# Patient Record
Sex: Female | Born: 1970 | Race: White | Hispanic: No | Marital: Married | Smoking: Never smoker
Health system: Southern US, Community
[De-identification: ages and names within clinical notes are randomized; demographics above are authoritative.]

## PROBLEM LIST (undated history)

## (undated) DIAGNOSIS — K589 Irritable bowel syndrome without diarrhea: Secondary | ICD-10-CM

## (undated) DIAGNOSIS — J45909 Unspecified asthma, uncomplicated: Secondary | ICD-10-CM

## (undated) DIAGNOSIS — K219 Gastro-esophageal reflux disease without esophagitis: Secondary | ICD-10-CM

## (undated) DIAGNOSIS — N200 Calculus of kidney: Secondary | ICD-10-CM

## (undated) DIAGNOSIS — J349 Unspecified disorder of nose and nasal sinuses: Secondary | ICD-10-CM

## (undated) DIAGNOSIS — G589 Mononeuropathy, unspecified: Secondary | ICD-10-CM

## (undated) DIAGNOSIS — Z0282 Encounter for adoption services: Secondary | ICD-10-CM

## (undated) DIAGNOSIS — R011 Cardiac murmur, unspecified: Secondary | ICD-10-CM

## (undated) DIAGNOSIS — N3090 Cystitis, unspecified without hematuria: Secondary | ICD-10-CM

## (undated) HISTORY — DX: Unspecified asthma, uncomplicated: J45.909

## (undated) HISTORY — PX: WISDOM TOOTH EXTRACTION: SHX21

## (undated) HISTORY — DX: Calculus of kidney: N20.0

## (undated) HISTORY — DX: Encounter for adoption services: Z02.82

## (undated) HISTORY — DX: Mononeuropathy, unspecified: G58.9

## (undated) HISTORY — DX: Irritable bowel syndrome without diarrhea: K58.9

## (undated) HISTORY — DX: Unspecified disorder of nose and nasal sinuses: J34.9

## (undated) HISTORY — DX: Cardiac murmur, unspecified: R01.1

## (undated) HISTORY — DX: Cystitis, unspecified without hematuria: N30.90

## (undated) HISTORY — DX: Gastro-esophageal reflux disease without esophagitis: K21.9

---

## 2014-04-05 ENCOUNTER — Ambulatory Visit: Payer: Self-pay

## 2014-05-23 HISTORY — PX: KIDNEY STONE SURGERY: SHX686

## 2014-06-07 ENCOUNTER — Ambulatory Visit: Payer: Self-pay | Admitting: Internal Medicine

## 2014-06-12 ENCOUNTER — Ambulatory Visit: Payer: Self-pay

## 2014-07-04 ENCOUNTER — Ambulatory Visit: Payer: Self-pay | Admitting: Urology

## 2014-07-04 LAB — CBC
HCT: 48 % — AB (ref 35.0–47.0)
HGB: 15.8 g/dL (ref 12.0–16.0)
MCH: 30.7 pg (ref 26.0–34.0)
MCHC: 33 g/dL (ref 32.0–36.0)
MCV: 93 fL (ref 80–100)
Platelet: 185 10*3/uL (ref 150–440)
RBC: 5.15 10*6/uL (ref 3.80–5.20)
RDW: 12.8 % (ref 11.5–14.5)
WBC: 6.2 10*3/uL (ref 3.6–11.0)

## 2014-07-04 LAB — BASIC METABOLIC PANEL
Anion Gap: 7 (ref 7–16)
BUN: 10 mg/dL (ref 7–18)
CO2: 27 mmol/L (ref 21–32)
Calcium, Total: 8.6 mg/dL (ref 8.5–10.1)
Chloride: 107 mmol/L (ref 98–107)
Creatinine: 0.66 mg/dL (ref 0.60–1.30)
EGFR (African American): >60
Glucose: 85 mg/dL (ref 65–99)
OSMOLALITY: 280 (ref 275–301)
POTASSIUM: 4.3 mmol/L (ref 3.5–5.1)
Sodium: 141 mmol/L (ref 136–145)

## 2014-07-11 ENCOUNTER — Ambulatory Visit: Payer: Self-pay | Admitting: Urology

## 2014-07-30 ENCOUNTER — Ambulatory Visit: Payer: Self-pay | Admitting: Urology

## 2014-11-24 ENCOUNTER — Emergency Department
Admit: 2014-11-24 | Disposition: A | Discharge status: Home / Self Care | Payer: Self-pay | Admitting: Emergency Medicine

## 2014-11-24 LAB — URINALYSIS, COMPLETE
BILIRUBIN, UR: NEGATIVE
Bacteria: NONE SEEN
Glucose,UR: NEGATIVE mg/dL (ref 0–75)
Ketone: NEGATIVE
LEUKOCYTE ESTERASE: NEGATIVE
Nitrite: NEGATIVE
Ph: 7 (ref 4.5–8.0)
Protein: NEGATIVE
RBC,UR: 1 /HPF (ref 0–5)
Specific Gravity: 1.018 (ref 1.003–1.030)

## 2014-11-24 LAB — CBC
HCT: 49.1 % — ABNORMAL HIGH (ref 35.0–47.0)
HGB: 15.9 g/dL (ref 12.0–16.0)
MCH: 29.8 pg (ref 26.0–34.0)
MCHC: 32.4 g/dL (ref 32.0–36.0)
MCV: 92 fL (ref 80–100)
Platelet: 159 10*3/uL (ref 150–440)
RBC: 5.35 10*6/uL — AB (ref 3.80–5.20)
RDW: 13.5 % (ref 11.5–14.5)
WBC: 6.5 10*3/uL (ref 3.6–11.0)

## 2014-11-24 LAB — COMPREHENSIVE METABOLIC PANEL
ALK PHOS: 113 U/L
ANION GAP: 5 — AB (ref 7–16)
Albumin: 3.6 g/dL
BILIRUBIN TOTAL: 0.4 mg/dL
BUN: 14 mg/dL
CHLORIDE: 107 mmol/L
CREATININE: 0.65 mg/dL
Calcium, Total: 8.6 mg/dL — ABNORMAL LOW
Co2: 27 mmol/L
EGFR (African American): >60
EGFR (Non-African Amer.): >60
Glucose: 108 mg/dL — ABNORMAL HIGH
POTASSIUM: 4.8 mmol/L
SGOT(AST): 29 U/L
SGPT (ALT): 24 U/L
Sodium: 139 mmol/L
Total Protein: 6.6 g/dL

## 2014-11-24 LAB — LIPASE, BLOOD: Lipase: 30 U/L

## 2014-11-24 LAB — TROPONIN I

## 2014-11-25 ENCOUNTER — Ambulatory Visit
Admit: 2014-11-25 | Disposition: A | Discharge status: Home / Self Care | Payer: Self-pay | Attending: Nurse Practitioner | Admitting: Nurse Practitioner

## 2014-11-27 ENCOUNTER — Encounter: Payer: Self-pay | Admitting: General Surgery

## 2014-11-27 ENCOUNTER — Ambulatory Visit (INDEPENDENT_AMBULATORY_CARE_PROVIDER_SITE_OTHER): Payer: Medicaid Other | Admitting: General Surgery

## 2014-11-27 ENCOUNTER — Ambulatory Visit: Admit: 2014-11-27 | Disposition: A | Discharge status: Home / Self Care | Payer: Self-pay | Admitting: General Surgery

## 2014-11-27 VITALS — BP 110/62 | HR 72 | Temp 98.5°F | Resp 12 | Ht 61.0 in | Wt 137.0 lb

## 2014-11-27 DIAGNOSIS — R1011 Right upper quadrant pain: Secondary | ICD-10-CM | POA: Insufficient documentation

## 2014-11-27 LAB — CBC WITH DIFFERENTIAL/PLATELET
Basophil #: 0 10*3/uL (ref 0.0–0.1)
Basophil %: 0.1 %
EOS ABS: 0 10*3/uL (ref 0.0–0.7)
Eosinophil %: 0.2 %
HCT: 51.9 % — ABNORMAL HIGH (ref 35.0–47.0)
HGB: 17.2 g/dL — ABNORMAL HIGH (ref 12.0–16.0)
Lymphocyte #: 1 10*3/uL (ref 1.0–3.6)
Lymphocyte %: 13.6 %
MCH: 30.1 pg (ref 26.0–34.0)
MCHC: 33.1 g/dL (ref 32.0–36.0)
MCV: 91 fL (ref 80–100)
Monocyte #: 0.6 x10 3/mm (ref 0.2–0.9)
Monocyte %: 8.3 %
NEUTROS ABS: 5.8 10*3/uL (ref 1.4–6.5)
Neutrophil %: 77.8 %
Platelet: 152 10*3/uL (ref 150–440)
RBC: 5.71 10*6/uL — ABNORMAL HIGH (ref 3.80–5.20)
RDW: 13.7 % (ref 11.5–14.5)
WBC: 7.5 10*3/uL (ref 3.6–11.0)

## 2014-11-27 NOTE — Addendum Note (Signed)
Addended by: Currie ParisHATCH, Britnay Magnussen M on: 11/27/2014 01:39 PM   Modules accepted: Level of Service

## 2014-11-27 NOTE — Progress Notes (Addendum)
Patient ID: Annette Kirby, female   DOB: [birth year], 44 y.o.   MRN: [id number]  Chief Complaint  Patient presents with  . Abdominal Pain    HPI Annette Kirby is a 44 y.o. female.  Here today for evalatuion of right abdominal pain. She states the pain started Friday. She is more nauseated, no vomiting. When she eats the pain is worse. Denies diarrhea. At times the pain has doubled her over. She states she has been bloated for at least a week. Bowels are regular.  The patient was diagnosed with IBS when she was in college, and this would manifest itself with intermittent bloating without severe pain. She reports she has never been troubled with severe constipation or diarrhea.  Over the years she has had trouble with irritable bowel syndrome which was associated with bloating.  Ultrasound was 11-24-14. Exam through the ED department. Normal ultrasound. Normal labs. CT scan was done 11-25-14. Reported appendix upper limit of normal to the left of the midline without periappendiceal inflammation.  She is here today with her husband. They are raising his daughter's 3 teenage daughters.   HPI  Past Medical History  Diagnosis Date  . Kidney stone   . GERD (gastroesophageal reflux disease)   . Sinus problem   . Asthma   . Irritable bowel syndrome   . Murmur   . Pinched nerve     External genitalia    Past Surgical History  Procedure Laterality Date  . Kidney stone surgery  Oct 2015    Dr Apolinar Junes  . Kidney stone surgery Left October 2015    Transurethral removal by Dr. Apolinar Junes.    Family History  Problem Relation Age of Onset  . Adopted: Yes  . Cancer Mother     Social History History  Substance Use Topics  . Smoking status: Never Smoker   . Smokeless tobacco: Never Used  . Alcohol Use: No    No Known Allergies  Current Outpatient Prescriptions  Medication Sig Dispense Refill  . ALPRAZolam (XANAX) 0.5 MG tablet Take 0.5 mg by mouth at bedtime as needed for  anxiety.    . Cholecalciferol (VITAMIN D-3) 1000 UNITS CAPS Take by mouth daily.    . CRYSELLE-28 0.3-30 MG-MCG tablet Take 1 tablet by mouth daily.   11  . loratadine (CLARITIN) 10 MG tablet Take 10 mg by mouth daily.    . ondansetron (ZOFRAN) 4 MG tablet Take 4 mg by mouth every 8 (eight) hours as needed for nausea or vomiting.    . pregabalin (LYRICA) 75 MG capsule Take 75 mg by mouth daily.    . traMADol (ULTRAM) 50 MG tablet Take 50 mg by mouth every 12 (twelve) hours as needed.     No current facility-administered medications for this visit.    Review of Systems Review of Systems  Constitutional: Negative.   Respiratory: Negative.   Cardiovascular: Negative.   Gastrointestinal: Positive for nausea and abdominal pain. Negative for vomiting and diarrhea.  Neurological: Positive for dizziness.    Blood pressure 110/62, pulse 72, temperature 98.5 F (36.9 C), temperature source Oral, resp. rate 12, height  (1.549 m), weight 137 lb (62.143 kg), last menstrual period 11/23/2014.  Physical Exam Physical Exam  Constitutional: She is oriented to person, place, and time. She appears well-developed and well-nourished.  Neck: Neck supple.  Cardiovascular: Normal rate and regular rhythm.   Murmur heard.  Systolic murmur is present with a grade of 1/6  Pulmonary/Chest: Effort normal and  breath sounds normal.  Abdominal: Soft. Normal appearance and bowel sounds are normal. There is tenderness in the right upper quadrant and left upper quadrant.    No referred pain. Scant left upper quadrant tenderness. No lower abdominal tenderness.  Evidence of discomfort with change in position from seated to supine.  Lymphadenopathy:    She has no cervical adenopathy.  Neurological: She is alert and oriented to person, place, and time.  Skin: Skin is warm and dry.    Data Reviewed Laboratory studies dated 11/24/2014 obtained to the emergency department were reviewed. Sinus rhythm with short  PR interval. Negative urinalysis (sees one plumps blood, 1 RBC/1 WBC per high-power field. Laboratory studies showed a hemoglobin of 15.9 with a white blood cell count of 6500. Platelet count of 159,000. Non-random blood sugar 108. Creatinine 0.6. Normal electrolytes. Normal liver function studies. Negative urine pregnancy test.  Abdominal ultrasound dated 11/24/2014 showed no gallbladder wall thickening or gallstones. Common bile duct 2.7 mm.  Abdominal CT dated 11/25/2014 showed the cecum MiraLAX mesentery with the appendix to the left of the midline. Chronic appendicolith. No strong evidence of acute appendicitis. Trace free fluid in the left hemipelvis adjacent to the adnexa. No acute inflammatory process. Nephrolithiasis improved from 2015. The patient has a fairly significant redundant colon, with stool through the right side suggesting long-standing low transit.  06/12/2014 CT obtained for suspected stone. Clinical presentation at that time was 3 weeks of nausea and low back pain and a history of kidney stones. Bilateral nonobstructing stones noted. Independent review of the films shows the appendix and the same size and shape as on her 11/25/2014 exam.  PCP notes of 11/25/2014 completed by Vincent GrosHeather Boscia, NP were reviewed. Epigastric, right upper quadrant and suprapubic pain reported.  ED record of 11/24/2014 showed pain in the retrosternal area as well as the hypogastrium.  Assessment    Unexplained abdominal pain, unlikely related to appendix.    Plan    With the focal tenderness being present in the right upper quadrant, we'll arrange for a HIDA scan with CCK and repeat her CBC. There is no history suggest peptic ulcer disease, and the patient has no risk factors for this  smoking close menses. No antecedent weight loss or weight gain except the normal monthly changes she appreciates with her menses.    Repeat CBC and schedule HIDA scan with CCK.  The patient was seen at the  hospital after her HIDA was CCK. She reports the CCK injection caused a severe amount of abdominal pain, 12 on a scale of 10. Her gallbladder ejection fraction was at the low end of normal at 36%. Prompt filling of the gallbladder and normal emptying of the common bile duct is identified. I think it's unlikely the biliary tree as the source for present symptoms.  Repeat CBC showed hemoglobin up to 17.1 suggestive of some degree of dehydration. White blood cell count remains normal.  On further questioning of the patient and her husband she can drink vitamin water without pain, and any other solid or liquid causes discomfort. She has no risk factors for peptic ulcer disease. I have no clear indication for surgical intervention. I think she is concerned that people think the pain is "in her head". I tried to reassure her as is not the case, but there is nothing objective to treat at this time, and have suggested attempts at symptomatic relief until things declare themselves.  She reports difficulty with swallowing pills. A prescription for Tylenol  3, 12.5 mg per 5 mL, 150 mL with the inscription 5-10 mL by mouth every 4 hours when necessary for pain was provided. Open C she is use this in the past with good results) sees. She was also placed on Prilosec 20 mg by mouth twice a day, Celebrex 100 mg twice a day and Reglan 10 mg before meals/daily at bedtime for the next week. She was asked to give a phone follow-up on Friday, April 8 shortly after 8:00 in the morning. Office visit will take place early next week.  PCP:  Clyda Hurdle 11/27/2014, 8:50 AM

## 2014-11-27 NOTE — Addendum Note (Signed)
Addended by: Earline MayotteBYRNETT, Gillian Meeuwsen W on: 11/27/2014 02:48 PM   Modules accepted: Level of Service

## 2014-11-27 NOTE — Patient Instructions (Signed)
Repeat labs, and schedule HIDA scan

## 2014-11-29 ENCOUNTER — Telehealth: Payer: Self-pay

## 2014-11-29 NOTE — Telephone Encounter (Signed)
Spoke with patient about having an upper endoscopy done next week and she is amendable to this. Patient is scheduled for an upper endoscopy at [facility] on 12/03/14.  She will call the hospital today to get pre registered. She is aware to call the day before between 1 and 3 pm to get her arrival time. Patient is aware of date and instructions.

## 2014-11-29 NOTE — Telephone Encounter (Signed)
Patient called to give an update on her condition today. She reports that her discomfort is about the same. She states that she is having burning and pain mostly in the mid abdomin just above her bellybutton. She has not gotten relief with the medications she is currently on. She is scheduled to be seen here on Monday 12/02/14. She was wondering if an upper endoscopy may be an option.

## 2014-11-29 NOTE — Telephone Encounter (Signed)
No clear etiology for the patient's pain. We'll arrange for an upper endoscopy.

## 2014-12-02 ENCOUNTER — Encounter: Payer: Self-pay | Admitting: General Surgery

## 2014-12-02 ENCOUNTER — Other Ambulatory Visit: Payer: Self-pay | Admitting: General Surgery

## 2014-12-02 ENCOUNTER — Ambulatory Visit (INDEPENDENT_AMBULATORY_CARE_PROVIDER_SITE_OTHER): Payer: Medicaid Other | Admitting: General Surgery

## 2014-12-02 VITALS — BP 130/78 | HR 88 | Temp 97.9°F | Resp 14 | Ht 61.0 in | Wt 139.0 lb

## 2014-12-02 DIAGNOSIS — R1011 Right upper quadrant pain: Secondary | ICD-10-CM

## 2014-12-02 NOTE — Progress Notes (Signed)
Patient ID: Annette Kirby, female   DOB: June 24, 1971, 44 y.o.   MRN: [id number]  Chief Complaint  Patient presents with  . Follow-up    abdominal pain    HPI Annette Kirby is a 44 y.o. female here following up for abdominal pain. She is scheduled for an upper endoscopy at [facility] for tomorrow 12/03/14. She reports that her stomach pain has improved and is now a 5 on the pain scale. The pain is now mostly in her mid abdomen. She gets relief if she is sitting back in a relaxed position. She is able to eat small amounts of food with out too much trouble. She is still having nausea, gas, and bloating. She does report two episodes of diaphoresis but no fever.   At the time of her last visit she was placed on Prilosec twice a day, Celebrex 100 mg twice a day and Reglan 10 mg with meals and at bedtime. A prescription for Tylenol 3 to use 5-10 mL by mouth every 4 hours if needed for pain was also provided.  She reported no real improvement after the first 48 hours and today is her planned follow-up.  She still reports pain with almost all oral intake, now including grits. Pain well-developed within 5 minutes of ingestion. Where her pain was in the right upper quadrant 5 days ago, now she reports more discomfort in the mid abdomen. There has been no vomiting. Bowels have been moving fairly normally.  While she reports feeling no better, her demeanor, her skin color, her movement in the office on his improved from last week.   HPI   Past Medical History  Diagnosis Date  . Kidney stone   . GERD (gastroesophageal reflux disease)   . Sinus problem   . Asthma   . Irritable bowel syndrome   . Murmur   . Pinched nerve     External genitalia    Past Surgical History  Procedure Laterality Date  . Kidney stone surgery  Oct 2015    Dr Apolinar Junes  . Kidney stone surgery Left October 2015    Transurethral removal by Dr. Apolinar Junes.    Family History  Problem Relation Age of Onset  . Adopted: Yes   . Cancer Mother     Social History History  Substance Use Topics  . Smoking status: Never Smoker   . Smokeless tobacco: Never Used  . Alcohol Use: No    No Known Allergies  Current Outpatient Prescriptions  Medication Sig Dispense Refill  . ALPRAZolam (XANAX) 0.5 MG tablet Take 0.5 mg by mouth at bedtime as needed for anxiety.    . Cholecalciferol (VITAMIN D-3) 1000 UNITS CAPS Take by mouth daily.    . CRYSELLE-28 0.3-30 MG-MCG tablet Take 1 tablet by mouth daily.   11  . loratadine (CLARITIN) 10 MG tablet Take 10 mg by mouth daily.    . ondansetron (ZOFRAN) 4 MG tablet Take 4 mg by mouth every 8 (eight) hours as needed for nausea or vomiting.    . pregabalin (LYRICA) 75 MG capsule Take 75 mg by mouth daily.    . traMADol (ULTRAM) 50 MG tablet Take 50 mg by mouth every 12 (twelve) hours as needed.     No current facility-administered medications for this visit.    Review of Systems Review of Systems  Constitutional: Negative.   Respiratory: Negative.   Cardiovascular: Negative.   Gastrointestinal: Positive for nausea, abdominal pain and abdominal distention. Negative for vomiting, diarrhea, constipation, blood  in stool, anal bleeding and rectal pain.    Blood pressure 130/78, pulse 88, temperature 97.9 F (36.6 C), resp. rate 14, height 5\' 1"  (1.549 m), weight 139 lb (63.05 kg), last menstrual period 11/23/2014.  Physical Exam Physical Exam  Constitutional: She is oriented to person, place, and time. She appears well-developed and well-nourished.  Neck: Neck supple.  Cardiovascular: Normal rate, regular rhythm and normal heart sounds.   Pulmonary/Chest: Effort normal and breath sounds normal.  Abdominal: Normal appearance and bowel sounds are normal. There is tenderness in the right upper quadrant and left upper quadrant.    Lymphadenopathy:    She has no cervical adenopathy.  Neurological: She is alert and oriented to person, place, and time.  Skin: Skin is warm  and dry.    Data Reviewed Prior CT studies.  Assessment    Abdominal pain, no clear etiology.    Plan    As right upper quadrant and epigastric pain was most pronounced at the time of her original evaluation 5 days ago, arrangements are in place for an upper endoscopy tomorrow. The patient knows to be nothing by mouth for solid foods after midnight, but may have clear liquids until 8 AM.    PCP: Walden FieldKhan, Fozia M   Dayson Aboud, Merrily PewJeffrey W 12/02/2014, 7:59 PM

## 2014-12-02 NOTE — Patient Instructions (Signed)
Esophagogastroduodenoscopy °Esophagogastroduodenoscopy (EGD) is a procedure to examine the lining of the esophagus, stomach, and first part of the small intestine (duodenum). A long, flexible, lighted tube with a camera attached (endoscope) is inserted down the throat to view these organs. This procedure is done to detect problems or abnormalities, such as inflammation, bleeding, ulcers, or growths, in order to treat them. The procedure lasts about 5-20 minutes. It is usually an outpatient procedure, but it may need to be performed in emergency cases in the hospital. °LET YOUR CAREGIVER KNOW ABOUT:  °· Allergies to food or medicine. °· All medicines you are taking, including vitamins, herbs, eyedrops, and over-the-counter medicines and creams. °· Use of steroids (by mouth or creams). °· Previous problems you or members of your family have had with the use of anesthetics. °· Any blood disorders you have. °· Previous surgeries you have had. °· Other health problems you have. °· Possibility of pregnancy, if this applies. °RISKS AND COMPLICATIONS  °Generally, EGD is a safe procedure. However, as with any procedure, complications can occur. Possible complications include: °· Infection. °· Bleeding. °· Tearing (perforation) of the esophagus, stomach, or duodenum. °· Difficulty breathing or not being able to breath. °· Excessive sweating. °· Spasms of the larynx. °· Slowed heartbeat. °· Low blood pressure. °BEFORE THE PROCEDURE °· Do not eat or drink anything for 6-8 hours before the procedure or as directed by your caregiver. °· Ask your caregiver about changing or stopping your regular medicines. °· If you wear dentures, be prepared to remove them before the procedure. °· Arrange for someone to drive you home after the procedure. °PROCEDURE  °· A vein will be accessed to give medicines and fluids. A medicine to relax you (sedative) and a pain reliever will be given through that access into the vein. °· A numbing medicine  (local anesthetic) may be sprayed on your throat for comfort and to stop you from gagging or coughing. °· A mouth guard may be placed in your mouth to protect your teeth and to keep you from biting on the endoscope. °· You will be asked to lie on your left side. °· The endoscope is inserted down your throat and into the esophagus, stomach, and duodenum. °· Air is put through the endoscope to allow your caregiver to view the lining of your esophagus clearly. °· The esophagus, stomach, and duodenum is then examined. During the exam, your caregiver may: °¨ Remove tissue to be examined under a microscope (biopsy) for inflammation, infection, or other medical problems. °¨ Remove growths. °¨ Remove objects (foreign bodies) that are stuck. °¨ Treat any bleeding with medicines or other devices that stop tissues from bleeding (hot cautery, clipping devices). °¨ Widen (dilate) or stretch narrowed areas of the esophagus and stomach. °· The endoscope will then be withdrawn. °AFTER THE PROCEDURE °· You will be taken to a recovery area to be monitored. You will be able to go home once you are stable and alert. °· Do not eat or drink anything until the local anesthetic and numbing medicines have worn off. You may choke. °· It is normal to feel bloated, have pain with swallowing, or have a sore throat for a short time. This will wear off. °· Your caregiver should be able to discuss his or her findings with you. It will take longer to discuss the test results if any biopsies were taken. °Document Released: 12/10/2004 Document Revised: 12/24/2013 Document Reviewed: 07/12/2012 °[employer]® Patient Information ©2015 [employer]. This information is not   intended to replace advice given to you by your health care provider. Make sure you discuss any questions you have with your health care provider. ° °

## 2014-12-03 ENCOUNTER — Ambulatory Visit
Admit: 2014-12-03 | Disposition: A | Discharge status: Home / Self Care | Payer: Self-pay | Attending: General Surgery | Admitting: General Surgery

## 2014-12-03 ENCOUNTER — Encounter (INDEPENDENT_AMBULATORY_CARE_PROVIDER_SITE_OTHER): Payer: Medicaid Other | Admitting: General Surgery

## 2014-12-03 DIAGNOSIS — K2900 Acute gastritis without bleeding: Secondary | ICD-10-CM

## 2014-12-03 DIAGNOSIS — R1013 Epigastric pain: Secondary | ICD-10-CM | POA: Diagnosis not present

## 2014-12-03 HISTORY — PX: UPPER GI ENDOSCOPY: SHX6162

## 2014-12-04 ENCOUNTER — Encounter: Payer: Self-pay | Admitting: General Surgery

## 2014-12-10 ENCOUNTER — Other Ambulatory Visit: Payer: Self-pay | Admitting: General Surgery

## 2014-12-12 ENCOUNTER — Encounter: Payer: Self-pay | Admitting: General Surgery

## 2014-12-12 ENCOUNTER — Ambulatory Visit (INDEPENDENT_AMBULATORY_CARE_PROVIDER_SITE_OTHER): Payer: Medicaid Other | Admitting: General Surgery

## 2014-12-12 ENCOUNTER — Other Ambulatory Visit: Payer: Self-pay | Admitting: General Surgery

## 2014-12-12 VITALS — BP 98/64 | HR 102 | Resp 16 | Ht 61.0 in | Wt 136.0 lb

## 2014-12-12 DIAGNOSIS — R1011 Right upper quadrant pain: Secondary | ICD-10-CM

## 2014-12-12 DIAGNOSIS — R1084 Generalized abdominal pain: Secondary | ICD-10-CM

## 2014-12-12 DIAGNOSIS — K389 Disease of appendix, unspecified: Secondary | ICD-10-CM | POA: Diagnosis not present

## 2014-12-12 NOTE — Progress Notes (Signed)
Patient ID: Annette Kirby, female   DOB: 11/30/1970, 44 y.o.   MRN: [id number]  Chief Complaint  Patient presents with  . Routine Post Op    upper endoscopy    HPI Annette Kirby is a 44 y.o. female here today for her post op upper endoscopy done on 12/04/14.  Her bowels daily with fiber cereal and states there is a more pronounced odor. Denies nausea or diarrhea. States she still limits how much she eats. The pain is more in the mid abdomen at present, and into the left lower quadrant. She is also experiencing some left flank pain. (Sees previous left renal calculus removed with ureteroscope). She still has upper abdominal pain and her left flank. She is most comfortable lying on her right side.  She is being followed by Dr. Apolinar JunesBrandon and has a MRI scan scheduled for tomorrow.   HPI  Past Medical History  Diagnosis Date  . Kidney stone   . GERD (gastroesophageal reflux disease)   . Sinus problem   . Asthma   . Irritable bowel syndrome   . Murmur   . Pinched nerve     External genitalia    Past Surgical History  Procedure Laterality Date  . Kidney stone surgery  Oct 2015    Dr Apolinar JunesBrandon  . Kidney stone surgery Left October 2015    Transurethral removal by Dr. Apolinar JunesBrandon.  Marland Kitchen. Upper gi endoscopy  12-03-14    Dr Lemar LivingsByrnett    Family History  Problem Relation Age of Onset  . Adopted: Yes  . Cancer Mother     Social History History  Substance Use Topics  . Smoking status: Never Smoker   . Smokeless tobacco: Never Used  . Alcohol Use: No    No Known Allergies  Current Outpatient Prescriptions  Medication Sig Dispense Refill  . ALPRAZolam (XANAX) 0.5 MG tablet Take 0.5 mg by mouth at bedtime as needed for anxiety.    . Cholecalciferol (VITAMIN D-3) 1000 UNITS CAPS Take by mouth daily.    . CRYSELLE-28 0.3-30 MG-MCG tablet Take 1 tablet by mouth daily.   11  . loratadine (CLARITIN) 10 MG tablet Take 10 mg by mouth daily.    Marland Kitchen. omeprazole (PRILOSEC) 20 MG capsule TAKE  ONE CAPSULE BY MOUTH TWICE A DAY 30 capsule 0  . pregabalin (LYRICA) 75 MG capsule Take 75 mg by mouth daily.    . traMADol (ULTRAM) 50 MG tablet Take 50 mg by mouth every 12 (twelve) hours as needed.     No current facility-administered medications for this visit.    Review of Systems Review of Systems  Constitutional: Negative.   Respiratory: Negative.   Cardiovascular: Negative.   Gastrointestinal: Negative for nausea, vomiting, diarrhea and constipation.  Neurological: Positive for light-headedness.    Blood pressure 98/64, pulse 102, resp. rate 16, height 5\' 1"  (1.549 m), weight 136 lb (61.689 kg), last menstrual period 11/23/2014.  Physical Exam Physical Exam  Constitutional: She is oriented to person, place, and time. She appears well-developed and well-nourished.  Neck: Neck supple.  Cardiovascular: Normal rate, regular rhythm and normal heart sounds.   Pulmonary/Chest: Effort normal and breath sounds normal.  Abdominal: Soft. Normal appearance. There is tenderness in the periumbilical area and left lower quadrant.    Lymphadenopathy:    She has no cervical adenopathy.  Neurological: She is alert and oriented to person, place, and time.  Skin: Skin is warm and dry.    Data Reviewed EGD showed evidence  of gastritis.   Previous CT showed an appendicolith but the appendix to the left of the midline.  CBC dated 11/27/2014 was notable only for an elevated hemoglobin.  Assessment    Persistent abdominal pain, appendicolith, possible chronic appendicitis.    Plan    Upper abdominal evaluation with ultrasound and EGD has failed to elucidate a clear source for her pain. Initially her pain was more upper abdomen rather than mid/lower abdomen, but the former has cleared and the latter accelerated.  The possibility that appendectomy may not relieve all of her discomfort was reviewed. The risks associated with diagnostic procedures including injury to organs and infection  was discussed.    Risk and benefits of laparoscopic appendectomy, patient agrees.  Patient's surgery has been scheduled for 12-20-14 at [facility]  PCP:  Kara Dies 12/12/2014, 7:12 PM

## 2014-12-12 NOTE — Patient Instructions (Addendum)
Laparoscopic Appendectomy Appendectomy is surgery to remove the appendix. Laparoscopic surgery uses several small cuts (incisions) instead of one large incision. Laparoscopic surgery offers a shorter recovery time and less discomfort. LET YOUR CAREGIVER KNOW ABOUT:  Allergies to food or medicine.  Medicines taken, including vitamins, dietary supplements, herbs, eyedrops, over-the-counter medicines, and creams.  Use of steroids (by mouth or creams).  Previous problems with anesthetics or numbing medicines.  History of bleeding problems or blood clots.  Previous surgery.  Other health problems, including diabetes, heart problems, lung problems, and kidney problems.  Possibility of pregnancy, if this applies. RISKS AND COMPLICATIONS  Infection. A germ starts growing in the wound. This can usually be treated with antibiotics. In some cases, the wound will need to be opened and cleaned.  Bleeding.  Damage to other organs.  Sores (abscesses).  Chronic pain at the incision sites. This is defined as pain that lasts for more than 3 months.  Blood clots in the legs that may rarely travel to the lungs.  Infection in the lungs (pneumonia). BEFORE THE PROCEDURE Appendectomy is usually performed immediately after an inflamed appendix (appendicitis) is diagnosed. No preparation is necessary ahead of this procedure. PROCEDURE  You will be given medicine that makes you sleep (general anesthetic). After you are asleep, a flexible tube (catheter) may be inserted into your bladder to drain your urine during surgery. The tube is removed before you wake up after surgery. When you are asleep, carbondioxide gas will be used to inflate your abdomen. This will allow your surgeon to see inside your abdomen and perform your surgery. Three small incisions will be made in your abdomen. Your surgeon will insert a thin, lighted tube (laparoscope) through one of the incisions. Your surgeon will look through the  laparoscope while performing the surgery. Other tools will be inserted through the other incisions. Laparoscopic procedures may not be appropriate when:  There is major scarring from a previous surgery.  The patient has bleeding disorders.  A pregnancy is near term.  There are other conditions which make the laparoscopic procedure impossible, such as an advanced infection or a ruptured appendix. If your surgeon feels it is not safe to continue with the laparoscopic procedure, he or she will perform an open surgery instead. This gives the surgeon a larger view and more space to work. Open surgery requires a longer recovery time. After your appendix is removed, your incisions will be closed with stitches (sutures) or skin adhesive. AFTER THE PROCEDURE You will be taken to a recovery room. When the anesthesia has worn off, you will be returned to your hospital room. You will be given pain medicines to keep you comfortable. Ask your caregiver how long your hospital stay will be. Document Released: 03/23/2004 Document Revised: 11/01/2011 Document Reviewed: 02/16/2011 [facility] Patient Information 2015 [employer] [place]. This information is not intended to replace advice given to you by your health care provider. Make sure you discuss any questions you have with your health care provider.  [facility]

## 2014-12-13 ENCOUNTER — Ambulatory Visit
Admit: 2014-12-13 | Disposition: A | Discharge status: Home / Self Care | Payer: Self-pay | Attending: Obstetrics and Gynecology | Admitting: Obstetrics and Gynecology

## 2014-12-14 NOTE — Op Note (Signed)
PATIENT NAME:  Annette Kirby, ELOISE M MR#:  [id number] DATE OF BIRTH:  1971/03/03  DATE OF PROCEDURE:  07/11/2014  PREOPERATIVE DIAGNOSIS: Left renal stones.  POSTOPERATIVE DIAGNOSIS: Left renal stones.   PROCEDURE PERFORMED: Left ureteroscopy, laser lithotripsy, left ureteral stent placement.   ANESTHESIA: General anesthesia.   ATTENDING SURGEON: Claris GladdenAshley J. Veniamin Kincaid, MD   ESTIMATED BLOOD LOSS: Minimal.   DRAINS: A 6 x 22 French double-J ureteral stent on the left.   SPECIMENS: Stone fragment.   COMPLICATIONS: None.   FINDINGS: Lower pole stones with net of mucosal tissue overlying and papillary hypertrophy.   INDICATION: This is a 44 year old female with intermittent left flank pain who underwent imaging showing fairly significant sized stones in her left lower pole. She was counseled on her various treatment options and has elected to undergo left ureteroscopy, laser lithotripsy, left ureteral stent placement for treatment of her stones. Risks and benefits of the procedure explained in detail. The patient agreed to proceed as planned.   PROCEDURE: The patient was correctly identified in the preoperative holding area and informed consent was obtained. She was brought to the operating suite and placed on the table in supine position. At this time universal timeout protocol was performed. All team members were identified. Venodyne boots were placed and she was administered 500 mg of IV Levaquin in the perioperative period. She was then placed under general anesthesia, repositioned in the dorsal lithotomy position, and prepped and draped in standard surgical fashion. At this point in time, a rigid cystoscope using the 21 French access sheath was advanced per urethra into the bladder and attention was turned to the left ureteral orifice. This was then cannulated using a 5 [place] open-ended ureteral catheter just within the UO and a Sensor wire was placed all the way up to the level of the renal  pelvis without difficulty under fluoroscopic guidance. Of note, the stone shadow could be seen in the lower pole consistent with the stones on  CT scan. Scope was then removed and a dual-lumen access sheath was used just within the ureter orifice to introduce a second Sensor wire up to the level of the kidney; 1 wire was snapped in place with a safety wire and an additional wire was used as a working wire. This point in time, a 12/14 [place] Cook ureter access sheath was advanced over the wires up to the level of the proximal ureter under fluoroscopic guidance without any difficulty. The inner cannula and wire were removed and a flexible Storz ureteroscope was then advanced through the access sheath into the kidney. A formal pyeloscopy was then performed and no obvious stone could be identified in any of the renal calyces. The scope was advanced to the extreme lower pole where the stones had been seen and, at this point, under careful inspection it appeared that there was a mucosal netting in this area and a stone could be seen behind this net of tissue. The decision was made to go ahead and treat these stones. A 265 micron laser fiber was then used using the settings of 0.2 and 50 Hz to obliterate the overlying mucosal tissue which was performed without difficulty, which revealed a fairly significant stone burden in the lower pole. These stones were dusted into very, very small pieces, less than 1 mm, 1 slightly larger fragment was then basketed out for a stone specimen. A retrograde pyelogram was then performed at the level of the UPJ to ensure that all calyces had been directly visualized,  which was then performed. At this point in time, the kidney was deemed stone free. The access sheath was then scoped out under direct visualization at which time one area of mucosal trauma was noted within the proximal ureter but was fairly unremarkable. The remainder of the ureter was completely intact without any stone  fragments or trauma. At this point in time, a 6 x 22 French double-J ureteral stent was advanced over the wire to the level of the renal pelvis. The wire was partially withdrawn and coil was noted within the renal pelvis. The wire was then fully withdrawn and coil was noted within the bladder. The bladder was then drained. The patient was reversed from anesthesia after being repositioned in the supine position and taken to the PACU in stable condition. There were no complications in this case.    ____________________________ Claris Gladden, MD ajb:bm D: 07/11/2014 14:47:26 ET T: 07/12/2014 02:13:18 ET JOB#: [id number]  cc: Claris Gladden, MD, <Dictator> Claris Gladden MD ELECTRONICALLY SIGNED 08/07/2014 10:41

## 2014-12-16 ENCOUNTER — Telehealth: Payer: Self-pay | Admitting: *Deleted

## 2014-12-16 NOTE — Telephone Encounter (Signed)
-----   Message from Earline MayotteJeffrey W Byrnett, MD sent at 12/13/2014  7:30 AM EDT ----- Please notify the OR I will need a Hasson cannula for this case.

## 2014-12-16 NOTE — Telephone Encounter (Signed)
Message left on the O.R. Posting line today.   Updated form faxed to the O.R. as well.

## 2014-12-18 ENCOUNTER — Emergency Department
Admit: 2014-12-18 | Disposition: A | Discharge status: Home / Self Care | Payer: Self-pay | Admitting: Emergency Medicine

## 2014-12-18 LAB — URINALYSIS, COMPLETE
BACTERIA: NONE SEEN
Bilirubin,UR: NEGATIVE
Blood: NEGATIVE
GLUCOSE, UR: NEGATIVE mg/dL (ref 0–75)
Ketone: NEGATIVE
Nitrite: NEGATIVE
PROTEIN: NEGATIVE
Ph: 6 (ref 4.5–8.0)
Specific Gravity: 1.009 (ref 1.003–1.030)

## 2014-12-18 LAB — CBC WITH DIFFERENTIAL/PLATELET
Basophil #: 0 x10 3/mm 3 (ref 0.0–0.1)
Basophil %: 0.6 %
Eosinophil #: 0 x10 3/mm 3 (ref 0.0–0.7)
Eosinophil %: 0.2 %
HCT: 43.4 % (ref 35.0–47.0)
HGB: 14.3 g/dL (ref 12.0–16.0)
Lymphocyte %: 27.5 %
Lymphs Abs: 1.5 x10 3/mm 3 (ref 1.0–3.6)
MCH: 29.9 pg (ref 26.0–34.0)
MCHC: 33 g/dL (ref 32.0–36.0)
MCV: 91 fL (ref 80–100)
Monocyte #: 0.8 x10 3/mm (ref 0.2–0.9)
Monocyte %: 13.5 %
Neutrophil #: 3.3 x10 3/mm 3 (ref 1.4–6.5)
Neutrophil %: 58.2 %
Platelet: 175 x10 3/mm 3 (ref 150–440)
RBC: 4.78 X10 6/mm 3 (ref 3.80–5.20)
RDW: 13.5 % (ref 11.5–14.5)
WBC: 5.6 x10 3/mm 3 (ref 3.6–11.0)

## 2014-12-18 LAB — BASIC METABOLIC PANEL
Anion Gap: 7 (ref 7–16)
BUN: 11 mg/dL
Calcium, Total: 8.2 mg/dL — ABNORMAL LOW
Chloride: 108 mmol/L
Co2: 24 mmol/L
Creatinine: 0.62 mg/dL
EGFR (African American): >60
EGFR (Non-African Amer.): >60
Glucose: 101 mg/dL — ABNORMAL HIGH
Potassium: 3.9 mmol/L
Sodium: 139 mmol/L

## 2014-12-18 LAB — TROPONIN I: Troponin-I: <0.03 ng/mL

## 2014-12-20 ENCOUNTER — Ambulatory Visit
Admit: 2014-12-20 | Disposition: A | Discharge status: Home / Self Care | Payer: Self-pay | Attending: General Surgery | Admitting: General Surgery

## 2014-12-20 ENCOUNTER — Encounter: Payer: Self-pay | Admitting: General Surgery

## 2014-12-20 ENCOUNTER — Other Ambulatory Visit: Payer: Self-pay

## 2014-12-20 ENCOUNTER — Encounter (HOSPITAL_BASED_OUTPATIENT_CLINIC_OR_DEPARTMENT_OTHER): Payer: Medicaid Other | Admitting: General Surgery

## 2014-12-20 DIAGNOSIS — K389 Disease of appendix, unspecified: Secondary | ICD-10-CM

## 2014-12-20 HISTORY — PX: APPENDECTOMY: SHX54

## 2014-12-22 NOTE — Op Note (Addendum)
PATIENT NAME:  Annette Kirby, Annette Kirby MR#:  [id number] DATE OF BIRTH:  November 20, 1970  DATE OF PROCEDURE:  12/20/2014  PREOPERATIVE DIAGNOSIS: Chronic abdominal pain, appendicolith on CT scanning.   POSTOPERATIVE DIAGNOSIS:  Chronic abdominal pain, appendicolith on CT scanning.   OPERATIVE PROCEDURE: Diagnostic laparoscopy, appendectomy.   SURGEON: Donnalee CurryJeffrey  Lindon Kiel, MD  ANESTHESIA: General endotracheal under Drs. Kephart/Thomas.   ESTIMATED BLOOD LOSS: Less than 5 mL.   CLINICAL NOTE: This 44 year old woman has  had atypical abdominal pain primarily in the mid to mid lower abdomen. Initial CT scan showed evidence of the cecum being left to the midline and evidence of an appendicolith with the appendix at the upper limit of normal at 6 mm. Her previous CT scans had showed similar location and size of the appendix and it was unclear that this was the source of her pain. She underwent upper endoscopy that showed some mild gastritis. She failed to improve with anti-inflammatories and was felt to be a candidate for diagnostic laparoscopy.  The morning of the procedure, the patient reported she had begun to develop severe left upper quadrant pain as well.   Chest exam at that time was negative.   The patient underwent general endotracheal anesthesia without difficulty. She received Mefoxin for prophylaxis. The abdomen was prepped with ChloraPrep and draped. As the previous CT had suggested the transverse colon residing immediately below the umbilicus, a Hassan technique was used to access the abdomen. A vertical incision was made in the upper umbilicus and carried down through the skin and subcutaneous tissue. She was found to have a 5 mm fascial defect and through this a 12 mm XL port was placed without difficulty. Pneumoperitoneum was established at 10 mmHg pressure. No evidence of injury from initial port placement. The appendix was noted below the level of the umbilicus to the left of the midline. A 5 mm  port in the left upper quadrant and in the left mid abdomen was then placed under direct vision. The appendix was dissected clear at the base of the cecum and the area divided with a blue Endo GIA cartridge. The mesoappendix was divided with 2 applications of the white vascular cartridge. Pulsatile bleeding through the staple line was controlled with electrocautery. The appendix was placed into an EndoCatch bag and then delivered through the [place] site without incident.   Inspection of the abdomen was completed from the diaphragm to the pelvis. The liver was normal on both lobes. The gallbladder was robin's egg blue. The spleen was unremarkable. No  obvious abnormality of the anterior surface of the stomach. The small bowel was visualized and normal down to the terminal ileum. The colon was unremarkable. The right and left ovaries were visualized and normal. No evidence of endometrial implants in the pelvis. The uterus was nulliparous. The bladder surface was unremarkable. No adhesions to the anterior or lateral abdominal walls were appreciated.   The abdomen was desufflated under direct vision. The fascia at the umbilicus was approximated with an 0 PDS figure-of-eight suture. Skin incisions were closed with 4-0 Vicryl subcuticular sutures. Benzoin, Steri-Strips, Telfa, and Tegaderm dressings were applied.   The patient tolerated the procedure well and was taken to the recovery room in stable condition.     ____________________________ Earline MayotteJeffrey W. Mickie Kozikowski, MD jwb:tr D: 12/20/2014 10:30:00 ET T: 12/20/2014 12:08:46 ET JOB#: [id number]  cc: Earline MayotteJeffrey W. Dacota Devall, MD, <Dictator> Lyndon CodeFozia Kirby. Khan, MD Rumi Taras Brion AlimentW Jihad Brownlow MD ELECTRONICALLY SIGNED 12/20/2014 22:50

## 2014-12-25 LAB — SURGICAL PATHOLOGY

## 2014-12-26 ENCOUNTER — Telehealth: Payer: Self-pay | Admitting: *Deleted

## 2014-12-26 NOTE — Telephone Encounter (Signed)
Patient called and she had an appendectomy on 12/20/14 by Dr.Byrnett, she has an appointment to come see him on Monday 12/30/14. Patient is complaining of swelling in her stomach area, and her belly button is now sticking out and it normally stays in. She said she feels a little hard area near her belly button. She wanted to know if this was normal and its okay to wait till Monday to see him.

## 2014-12-26 NOTE — Telephone Encounter (Signed)
Patient contacted. Swelling at the surgical site is not unexpected. This was a largest port site. Otherwise she is noted improvement in her abdominal pain. She'll follow-up as previously scheduled on 12/30/2014.

## 2014-12-30 ENCOUNTER — Ambulatory Visit (INDEPENDENT_AMBULATORY_CARE_PROVIDER_SITE_OTHER): Payer: Medicaid Other | Admitting: General Surgery

## 2014-12-30 ENCOUNTER — Encounter: Payer: Self-pay | Admitting: General Surgery

## 2014-12-30 VITALS — BP 118/68 | HR 66 | Resp 12 | Ht 61.0 in | Wt 131.0 lb

## 2014-12-30 DIAGNOSIS — K3580 Unspecified acute appendicitis: Secondary | ICD-10-CM

## 2014-12-30 NOTE — Progress Notes (Signed)
Patient ID: Augustin CoupeElouise M Greggs, female   DOB: 12/22/1970, 44 y.o.   MRN: [id number]  Chief Complaint  Patient presents with  . Other    Post Op Appendectomy    HPI Lynnlee Marcelle OverlieM Derhammer is a 44 y.o. female. Patient here for post op appendectomy done on 12/20/14. Patient reports she has been experiencing some bloating after she eats and has had some nausea. She also states her dog jumped on her stomach and it is sore.   HPI  Past Medical History  Diagnosis Date  . Kidney stone   . GERD (gastroesophageal reflux disease)   . Sinus problem   . Asthma   . Irritable bowel syndrome   . Murmur   . Pinched nerve     External genitalia    Past Surgical History  Procedure Laterality Date  . Kidney stone surgery  Oct 2015    Dr Apolinar JunesBrandon  . Kidney stone surgery Left October 2015    Transurethral removal by Dr. Apolinar JunesBrandon.  Marland Kitchen. Upper gi endoscopy  12-03-14    Dr Lemar LivingsByrnett    Family History  Problem Relation Age of Onset  . Adopted: Yes  . Cancer Mother     Social History History  Substance Use Topics  . Smoking status: Never Smoker   . Smokeless tobacco: Never Used  . Alcohol Use: No    No Known Allergies  Current Outpatient Prescriptions  Medication Sig Dispense Refill  . ALPRAZolam (XANAX) 0.5 MG tablet Take 0.5 mg by mouth at bedtime as needed for anxiety.    . Cholecalciferol (VITAMIN D-3) 1000 UNITS CAPS Take by mouth daily.    . CRYSELLE-28 0.3-30 MG-MCG tablet Take 1 tablet by mouth daily.   11  . loratadine (CLARITIN) 10 MG tablet Take 10 mg by mouth daily.    Marland Kitchen. omeprazole (PRILOSEC) 20 MG capsule TAKE ONE CAPSULE BY MOUTH TWICE A DAY 30 capsule 0  . pregabalin (LYRICA) 75 MG capsule Take 75 mg by mouth daily.    . traMADol (ULTRAM) 50 MG tablet Take 50 mg by mouth every 12 (twelve) hours as needed.     No current facility-administered medications for this visit.    Review of Systems Review of Systems  Constitutional: Negative.   Respiratory: Negative.    Cardiovascular: Negative.     Blood pressure 118/68, pulse 66, resp. rate 12, height 5\' 1"  (1.549 m), weight 131 lb (59.421 kg), last menstrual period 12/19/2014.  Physical Exam Physical Exam  Constitutional: She is oriented to person, place, and time. She appears well-developed and well-nourished.  Eyes: Pupils are equal, round, and reactive to light.  Cardiovascular: Normal rate, regular rhythm and normal heart sounds.   Pulmonary/Chest: Effort normal and breath sounds normal.  Abdominal: Soft. Bowel sounds are normal.  incision well healed  Neurological: She is alert and oriented to person, place, and time.  Skin: Skin is dry.    Data Reviewed Pathology showed early acute appendicitis.  Assessment    Well healed incisions.    Plan    The patient in general looks and feels better than she did prior to procedure. The appendix was at the upper limit of normal on CT and slightly erythematous on visual inspection at the time of laparoscopy. I'm thrilled that her symptoms have improved. Patient to follow up in 3 weeks.  PCP: Kara DiesKhan,Fozia        Byrnett, Jeffrey W 12/31/2014, 12:30 PM

## 2014-12-30 NOTE — Patient Instructions (Signed)
No heavy lifting 

## 2014-12-31 ENCOUNTER — Other Ambulatory Visit: Payer: Self-pay | Admitting: General Surgery

## 2015-01-03 LAB — SURGICAL PATHOLOGY

## 2015-01-19 ENCOUNTER — Other Ambulatory Visit: Payer: Self-pay | Admitting: General Surgery

## 2015-01-23 ENCOUNTER — Ambulatory Visit: Payer: Medicaid Other | Admitting: General Surgery

## 2015-02-04 ENCOUNTER — Encounter: Payer: Self-pay | Admitting: General Surgery

## 2015-02-04 ENCOUNTER — Ambulatory Visit (INDEPENDENT_AMBULATORY_CARE_PROVIDER_SITE_OTHER): Payer: Medicaid Other | Admitting: General Surgery

## 2015-02-04 VITALS — BP 118/68 | HR 64 | Resp 12 | Ht 61.0 in | Wt 135.0 lb

## 2015-02-04 DIAGNOSIS — K3580 Unspecified acute appendicitis: Secondary | ICD-10-CM | POA: Insufficient documentation

## 2015-02-04 NOTE — Progress Notes (Signed)
Patient ID: Annette Kirby, female   DOB: 1970-12-11, 44 y.o.   MRN: [id number]  Chief Complaint  Patient presents with  . Follow-up    Appendectomy    HPI Annette Kirby is a 44 y.o. female here today for post operation laparoscopy appendectomy done on 12/20/14. She states she is doing well.  HPI  Past Medical History  Diagnosis Date  . Kidney stone   . GERD (gastroesophageal reflux disease)   . Sinus problem   . Asthma   . Irritable bowel syndrome   . Murmur   . Pinched nerve     External genitalia    Past Surgical History  Procedure Laterality Date  . Kidney stone surgery  Oct 2015    Dr Apolinar Junes  . Kidney stone surgery Left October 2015    Transurethral removal by Dr. Apolinar Junes.  Marland Kitchen Upper gi endoscopy  12-03-14    Dr Lemar Livings  . Appendectomy  12/20/14    Family History  Problem Relation Age of Onset  . Adopted: Yes  . Cancer Mother     Social History History  Substance Use Topics  . Smoking status: Never Smoker   . Smokeless tobacco: Never Used  . Alcohol Use: No    No Known Allergies  Current Outpatient Prescriptions  Medication Sig Dispense Refill  . ALPRAZolam (XANAX) 0.5 MG tablet Take 0.5 mg by mouth at bedtime as needed for anxiety.    . Cholecalciferol (VITAMIN D-3) 1000 UNITS CAPS Take by mouth daily.    . CRYSELLE-28 0.3-30 MG-MCG tablet Take 1 tablet by mouth daily.   11  . loratadine (CLARITIN) 10 MG tablet Take 10 mg by mouth daily.    Marland Kitchen omeprazole (PRILOSEC) 20 MG capsule TAKE ONE CAPSULE BY MOUTH TWICE A DAY 30 capsule 0  . pregabalin (LYRICA) 75 MG capsule Take 75 mg by mouth daily.    . traMADol (ULTRAM) 50 MG tablet Take 50 mg by mouth every 12 (twelve) hours as needed.     No current facility-administered medications for this visit.    Review of Systems Review of Systems  Constitutional: Negative.   Respiratory: Negative.   Cardiovascular: Negative.     Blood pressure 118/68, pulse 64, resp. rate 12, height 5\' 1"  (1.549  m), weight 135 lb (61.236 kg).  Physical Exam Physical Exam  Constitutional: She is oriented to person, place, and time. She appears well-developed and well-nourished.  Abdominal:  Well healed incision   Neurological: She is alert and oriented to person, place, and time.  Skin: Skin is warm and dry.       Assessment    Doing well s/p appendectomy      Plan    Follow up on an as needed basis.       Call back with any questions or concerns.  DJT:[id number],[id number]   Earline Mayotte 02/04/2015, 1:44 PM

## 2015-02-19 ENCOUNTER — Other Ambulatory Visit: Payer: Self-pay | Admitting: General Surgery

## 2015-02-27 ENCOUNTER — Other Ambulatory Visit: Payer: Self-pay | Admitting: General Surgery

## 2015-07-03 IMAGING — CT CT ABD-PELV W/ CM
2 of 5 series · 15 of 46 positions shown, 17 images · IV contrast (omnipaque)
Comparison: Right upper quadrant ultrasound 11/24/2014. Noncontrast
CT Abdomen and Pelvis 06/12/2014

CLINICAL DATA: 43-year-old female with right side abdominal pain
with nausea for 2 days. Initial encounter.

EXAM:
CT ABDOMEN AND PELVIS WITH CONTRAST
TECHNIQUE: Multidetector CT imaging of the abdomen and pelvis was performed
using the standard protocol following bolus administration of
intravenous contrast.
CONTRAST:  100 mL Omnipaque 300

[Series 2: routine abd pel with · axial · 0.68mm/px · z∈[-398,+17]mm · 12 of 93 slices shown, 14 images]
[im 5/93  soft-tissue]
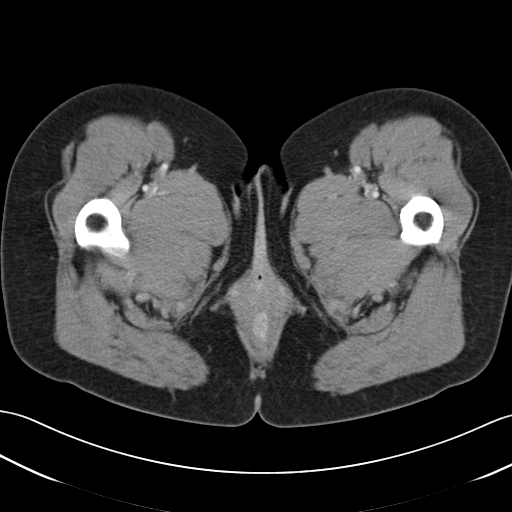
[im 5/93  bone]
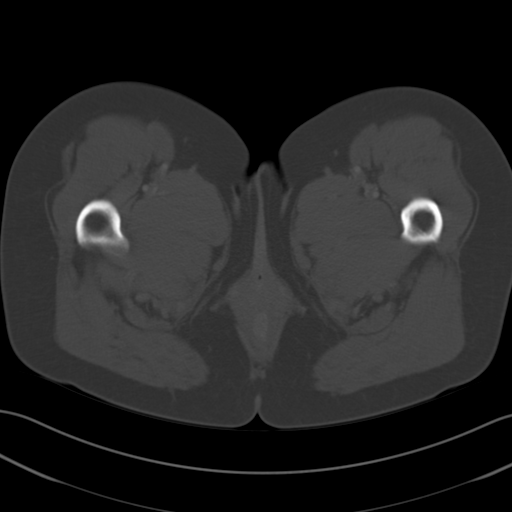
[im 14/93  soft-tissue]
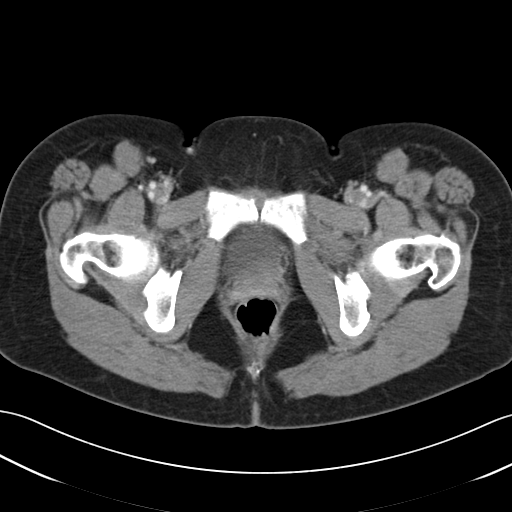
[im 19/93  soft-tissue]
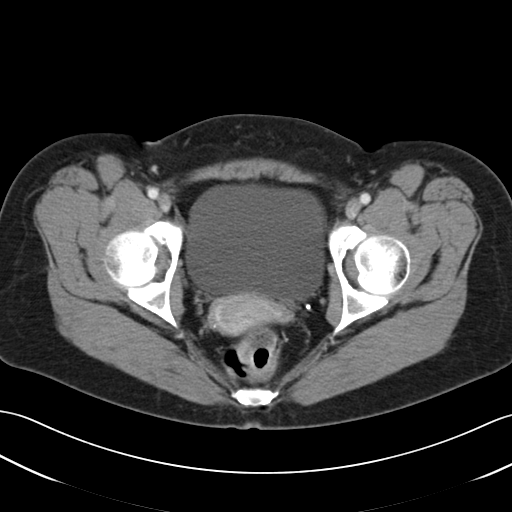
[im 28/93  soft-tissue]
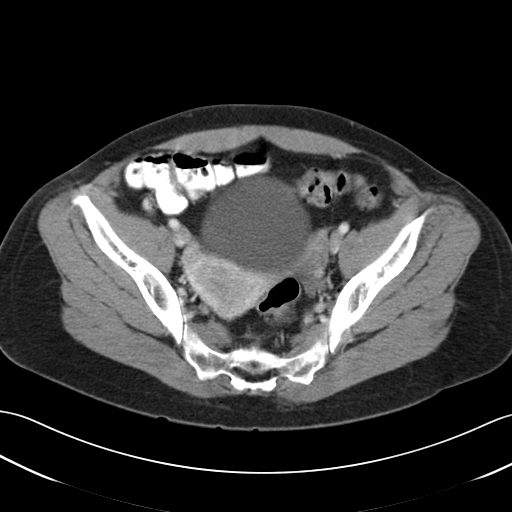
[im 37/93  soft-tissue]
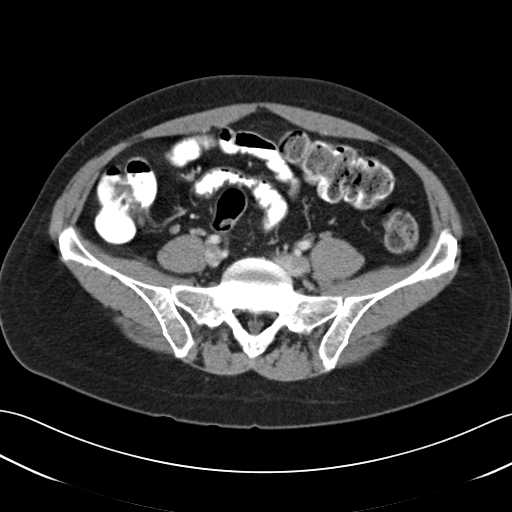
[im 42/93  soft-tissue]
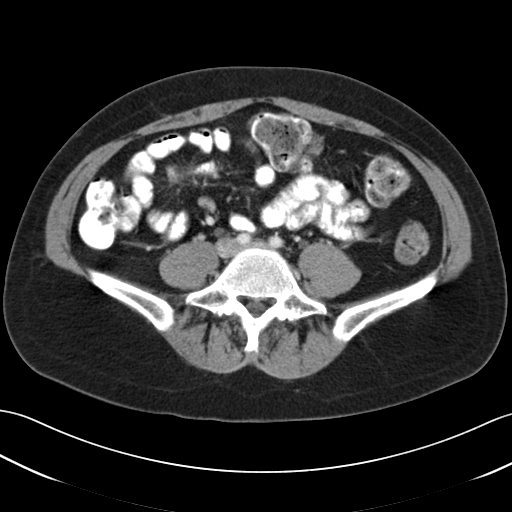
[im 51/93  soft-tissue]
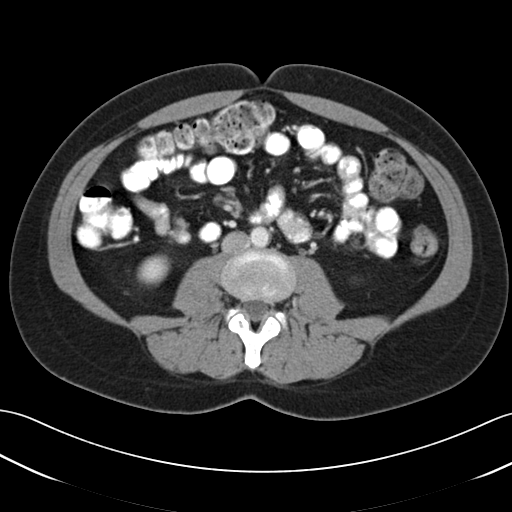
[im 56/93  soft-tissue]
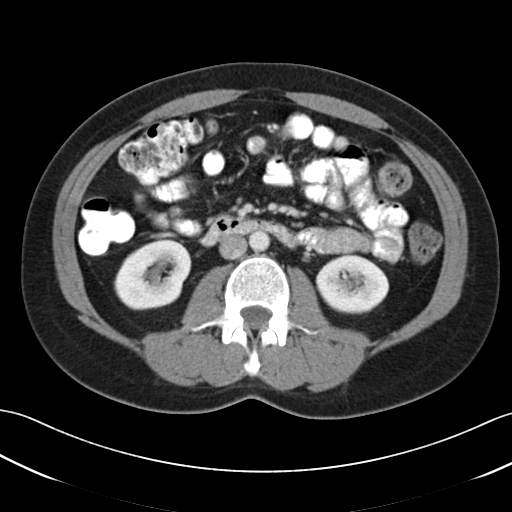
[im 65/93  soft-tissue]
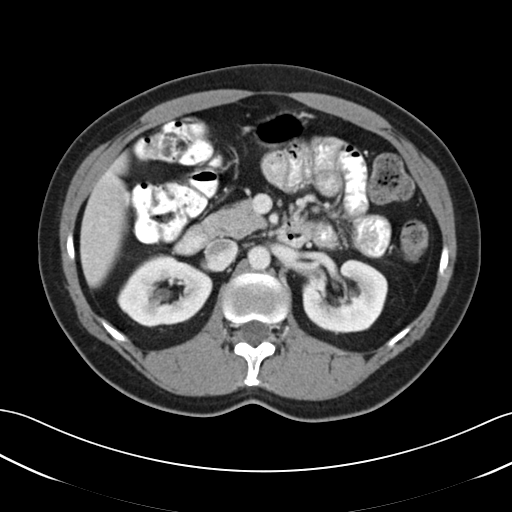
[im 65/93  bone]
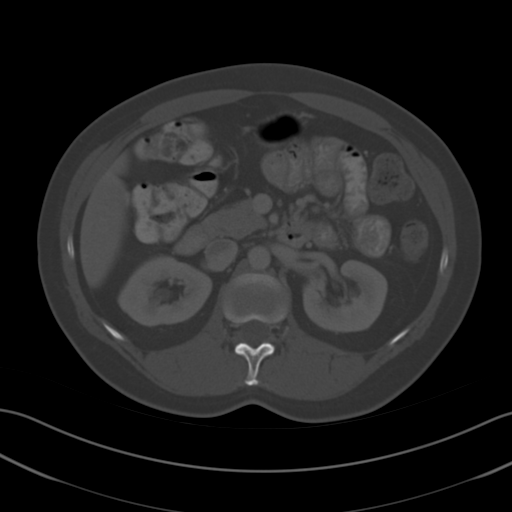
[im 74/93  soft-tissue]
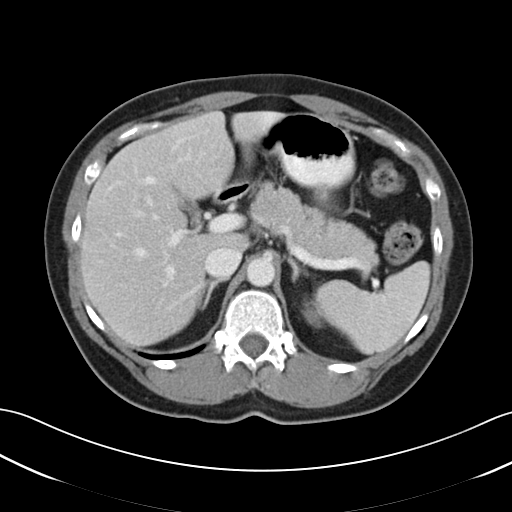
[im 79/93  soft-tissue]
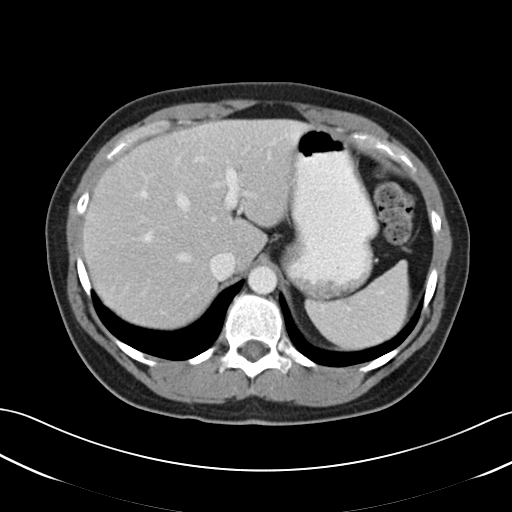
[im 88/93  soft-tissue]
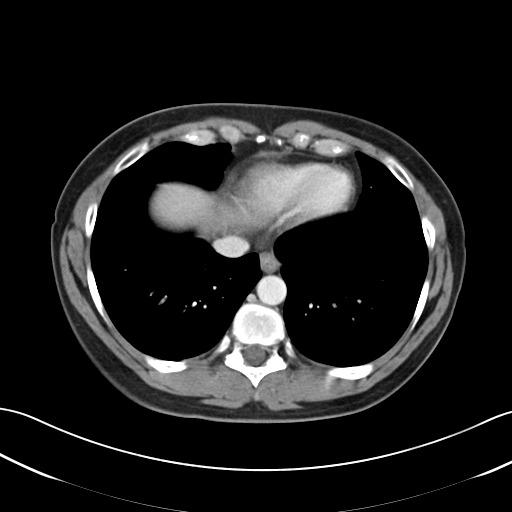

[Series 6: cor routine abd pel with · coronal · 0.74mm/px · 3 of 123 slices shown]
[im 41/123  soft-tissue]
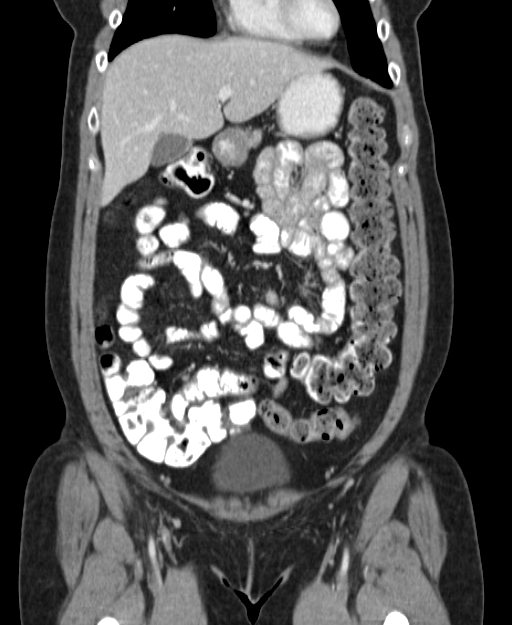
[im 55/123  soft-tissue]
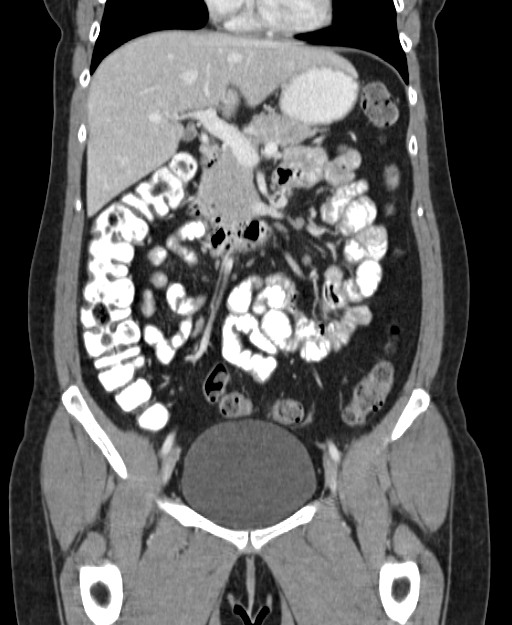
[im 68/123  soft-tissue]
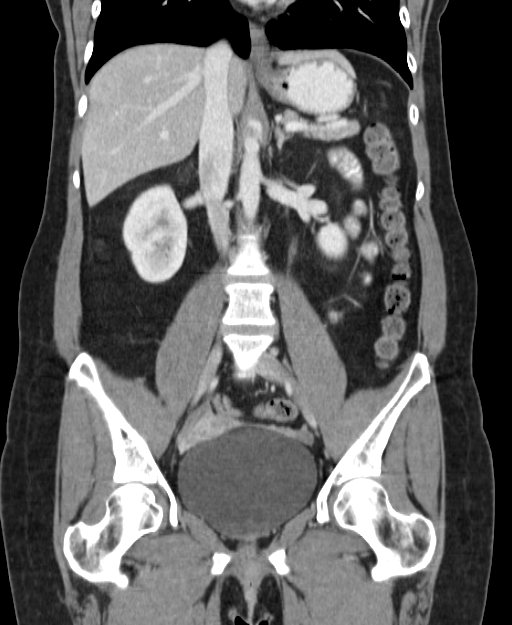

[15 of 46 positions shown; findings below may reference images not displayed]

FINDINGS: Negative lung bases.  No pericardial or pleural effusion.

Stable and negative visualized osseous structures.

Trace free fluid in the left hemipelvis near the left adnexa.
Negative uterus and adnexa otherwise. Negative distal colon aside
from retained stool. Unremarkable bladder.

Negative sigmoid colon other than redundancy. Retained stool
throughout the colon. Oral contrast has reached the distal
transverse colon which is redundant. Normal right colon, the cecum
is on a lax mesentery and extends to the midline. The appendix then
courses to the left across midline. The appendix measures at the
upper limits of normal, 6 mm, and there is probable chronic
appendicolith on the basis of the previous noncontrast study.
However, there is no convincing inflammatory stranding along the
course of the appendix.

Negative terminal ileum. No dilated or inflamed small bowel loops
are identified. Negative stomach and duodenum.

Liver, gallbladder, spleen, pancreas, and adrenal glands are within
normal limits. The portal venous system is patent. No abdominal free
fluid.

Bilateral renal contrast enhancement and excretion is normal. No
perinephric stranding. No right hydroureter or periureteral
stranding. Negative course of the right ureter. Left lower pole
nephrolithiasis has regressed, with 2 mm residual calculi in the
left lower pole. No definite right nephrolithiasis.

Major arterial structures are patent. There is mild Aortoiliac
calcified atherosclerosis noted. No lymphadenopathy.
IMPRESSION: 1. The cecum is on a lax mesentery and the appendix arises in the
midline tracking to the left. There is probably a chronic
appendicolith, but there is no strong evidence of acute appendicitis
at this time.
2. Trace free fluid in the left hemipelvis adjacent to the left
adnexal likely is physiologic.
3. Otherwise no acute or inflammatory process identified in the
abdomen or pelvis.
4. Regressed left nephrolithiasis since [DATE].

## 2015-07-21 IMAGING — CR DG ABDOMEN 1V
1 series · 2 of 2 positions shown · non-contrast
Comparison: CT 11/25/2014.

CLINICAL DATA: Pain.

EXAM:
ABDOMEN - 1 VIEW

[Series 1: kdxr kidney ureter bladder · 0.14mm/px · 2 of 2 slices shown]
[im 1/2]
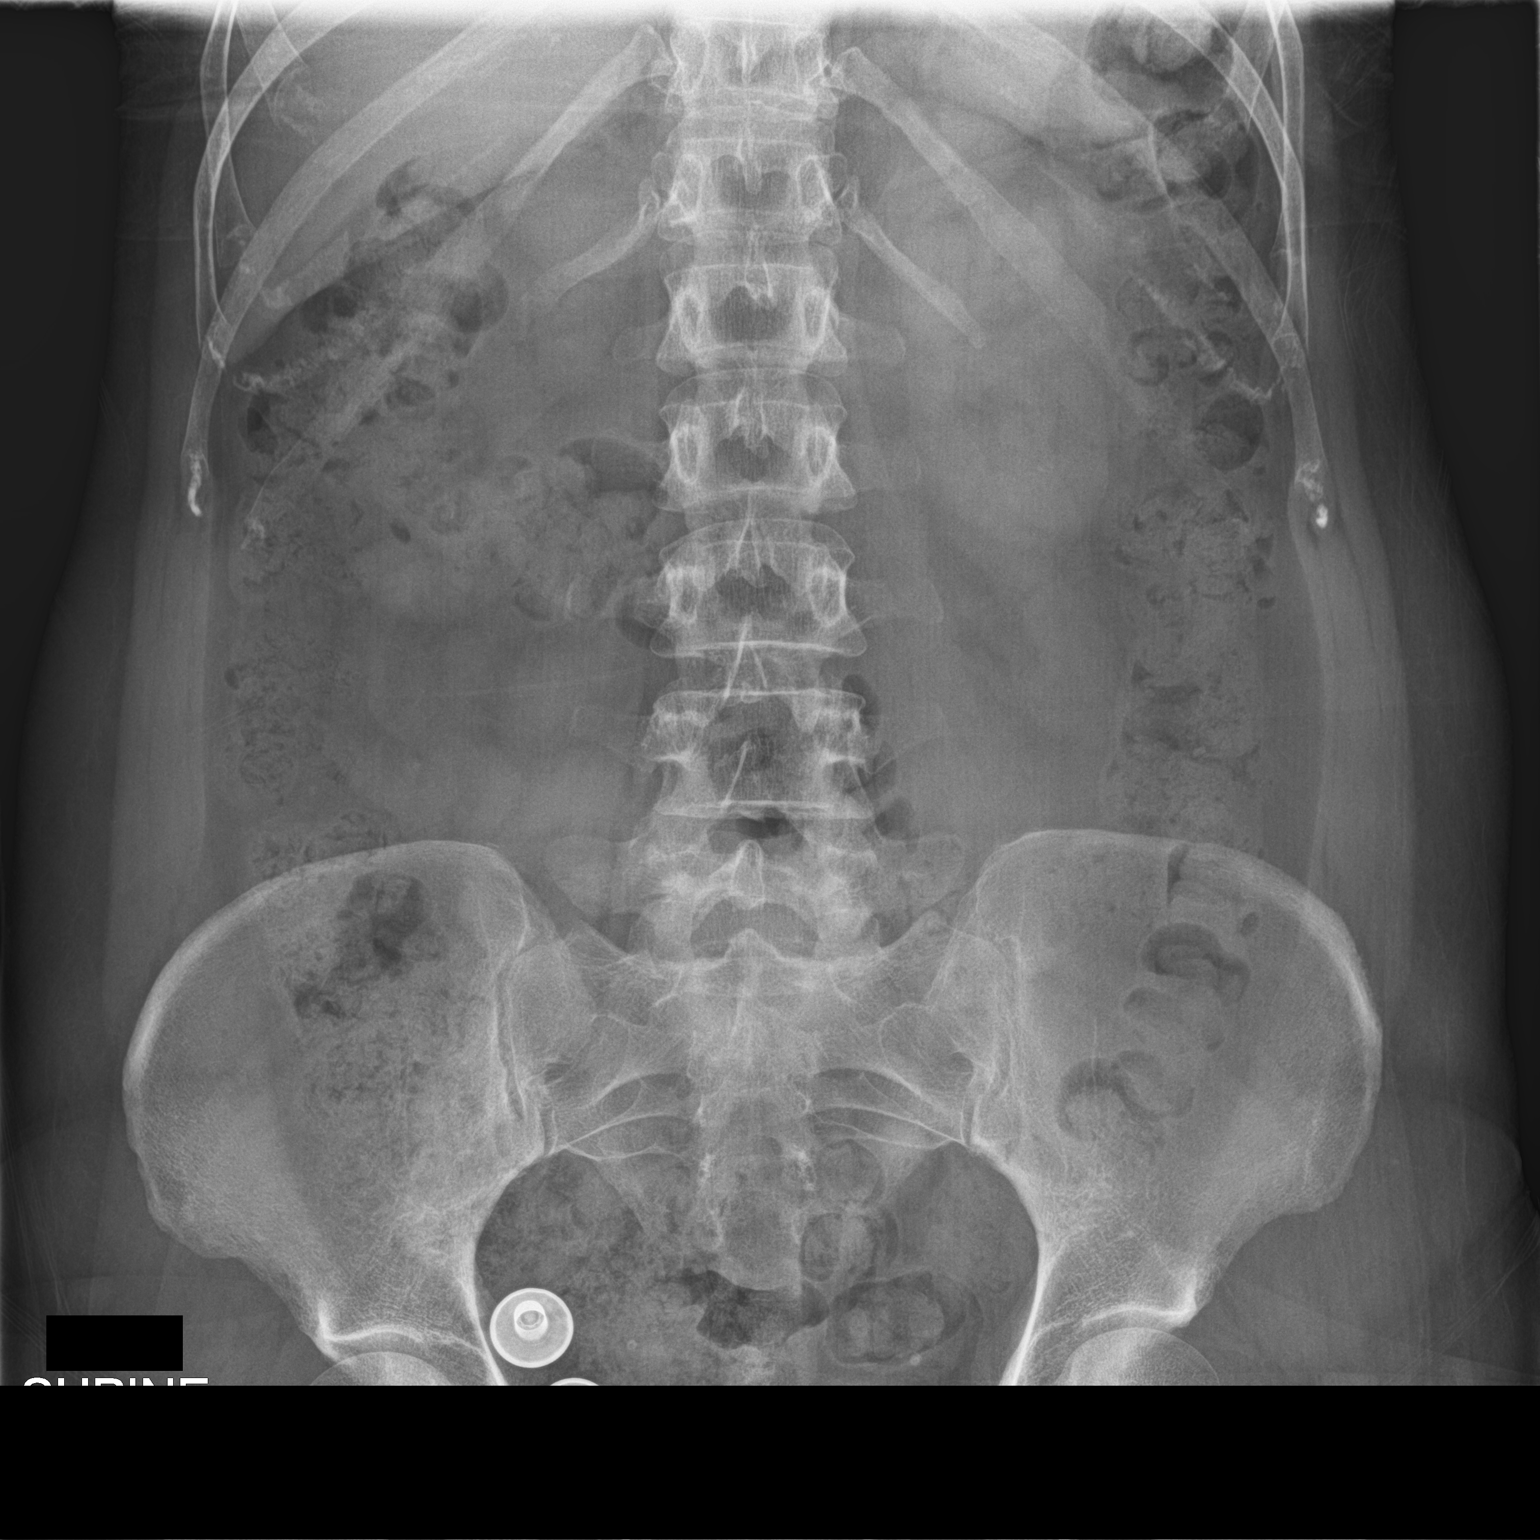
[im 2/2]
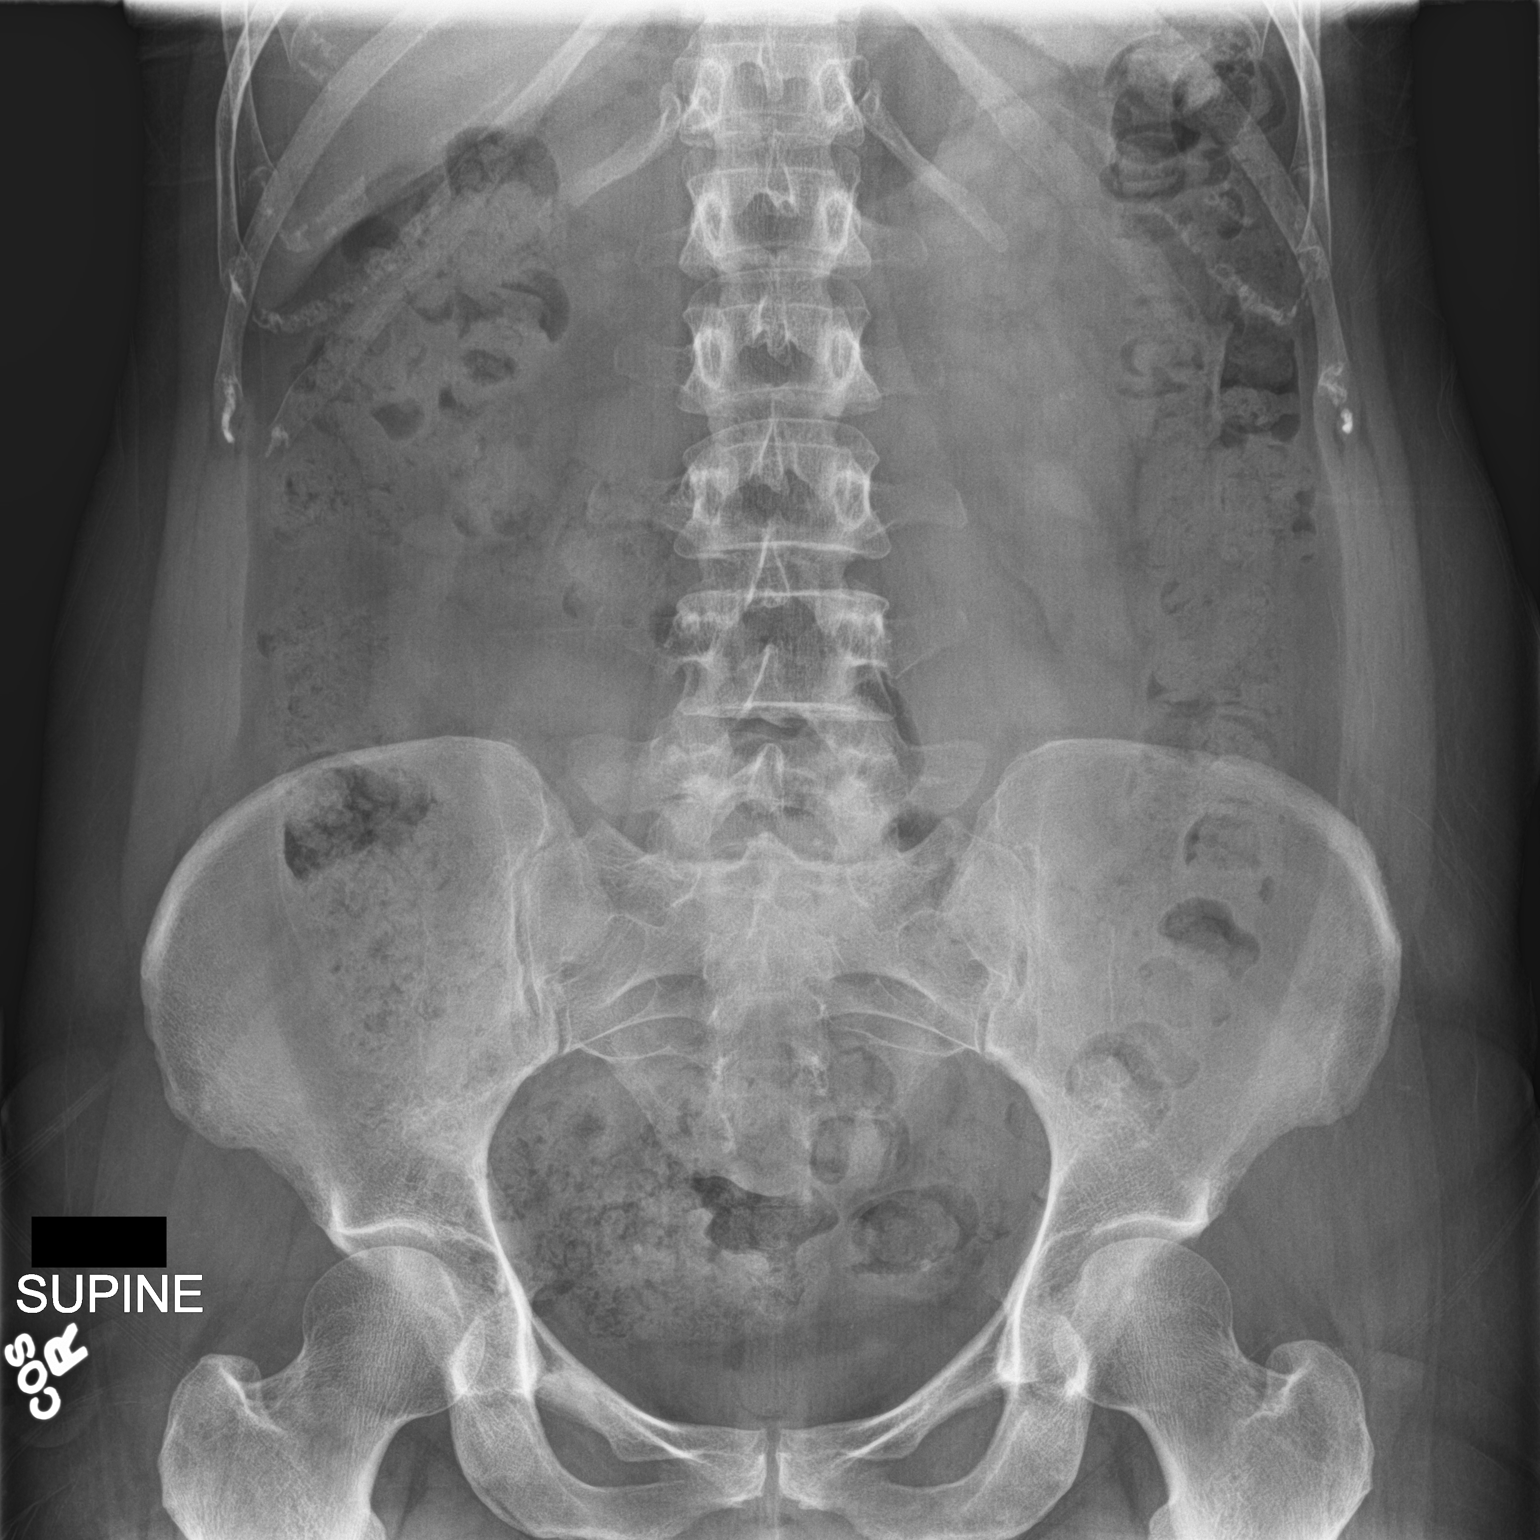

[2 of 2 positions shown; findings below may reference images not displayed]

FINDINGS: Soft tissue structures are unremarkable. Calcifications in pelvis
consistent phleboliths. No bowel distention. Prominent stool noted
throughout the colon. Constipation cannot be excluded. No free air.
No acute bony abnormality .
IMPRESSION: Prominent stool noted throughout the colon. Constipation cannot be
excluded. No bowel distention.

## 2015-12-01 ENCOUNTER — Encounter: Payer: Self-pay | Admitting: *Deleted

## 2015-12-11 ENCOUNTER — Ambulatory Visit: Payer: Self-pay | Admitting: Urology

## 2015-12-11 ENCOUNTER — Encounter: Payer: Self-pay | Admitting: Urology
